# Patient Record
Sex: Female | Born: 1965 | Race: White | Hispanic: No | Marital: Married | Smoking: Never smoker
Health system: Southern US, Community
[De-identification: ages and names within clinical notes are randomized; demographics above are authoritative.]

## PROBLEM LIST (undated history)

## (undated) DIAGNOSIS — F419 Anxiety disorder, unspecified: Secondary | ICD-10-CM

## (undated) DIAGNOSIS — M549 Dorsalgia, unspecified: Secondary | ICD-10-CM

## (undated) DIAGNOSIS — G8929 Other chronic pain: Secondary | ICD-10-CM

## (undated) HISTORY — PX: TUBAL LIGATION: SHX77

---

## 2015-07-29 ENCOUNTER — Encounter (HOSPITAL_BASED_OUTPATIENT_CLINIC_OR_DEPARTMENT_OTHER): Payer: Self-pay | Admitting: *Deleted

## 2015-07-29 ENCOUNTER — Emergency Department (HOSPITAL_BASED_OUTPATIENT_CLINIC_OR_DEPARTMENT_OTHER)
Admission: EM | Admit: 2015-07-29 | Discharge: 2015-07-29 | Disposition: A | Discharge status: Home / Self Care | Payer: Self-pay | Attending: Emergency Medicine | Admitting: Emergency Medicine

## 2015-07-29 DIAGNOSIS — L0291 Cutaneous abscess, unspecified: Secondary | ICD-10-CM

## 2015-07-29 DIAGNOSIS — F419 Anxiety disorder, unspecified: Secondary | ICD-10-CM | POA: Insufficient documentation

## 2015-07-29 DIAGNOSIS — G8929 Other chronic pain: Secondary | ICD-10-CM | POA: Insufficient documentation

## 2015-07-29 DIAGNOSIS — L02212 Cutaneous abscess of back [any part, except buttock]: Secondary | ICD-10-CM | POA: Insufficient documentation

## 2015-07-29 HISTORY — DX: Anxiety disorder, unspecified: F41.9

## 2015-07-29 HISTORY — DX: Other chronic pain: G89.29

## 2015-07-29 HISTORY — DX: Dorsalgia, unspecified: M54.9

## 2015-07-29 MED ORDER — SULFAMETHOXAZOLE-TRIMETHOPRIM 800-160 MG PO TABS: 1.0000 | ORAL_TABLET | Freq: Two times a day (BID) | ORAL | Status: AC

## 2015-07-29 NOTE — Discharge Instructions (Signed)
Please read and follow all provided instructions.  Your diagnoses today include:  1. Abscess     Tests performed today include:  Vital signs. See below for your results today.   Medications prescribed:   Bactrim (trimethoprim/sulfamethoxazole) - antibiotic  You have been prescribed an antibiotic medicine: take the entire course of medicine even if you are feeling better. Stopping early can cause the antibiotic not to work.  Take any prescribed medications only as directed.   Home care instructions:   Follow any educational materials contained in this packet  Follow-up instructions: Return to the Emergency Department in 48 hours for a recheck if your symptoms are not significantly improved.  Return instructions:  Return to the Emergency Department if you have:  Fever  Worsening symptoms  Worsening pain  Worsening swelling  Redness of the skin that moves away from the affected area, especially if it streaks away from the affected area   Any other emergent concerns  Your vital signs today were: BP 135/89 mmHg   Pulse 86   Temp(Src) 98.2 F (36.8 C) (Oral)   Resp 16   Ht 5\' 2"  (1.575 m)   Wt 204 lb (92.534 kg)   BMI 37.30 kg/m2   SpO2 100% If your blood pressure (BP) was elevated above 135/85 this visit, please have this repeated by your doctor within one month. --------------

## 2015-07-29 NOTE — ED Provider Notes (Signed)
CSN: [id number]     Arrival date & time 07/29/15  1521 History   First MD Initiated Contact with Patient 07/29/15 1535     Chief Complaint  Patient presents with  . Abscess     (Consider location/radiation/quality/duration/timing/severity/associated sxs/prior Treatment) HPI Comments: Patient presents with complaint of abscess to the middle of her back which has been occurring for the past 2 weeks. Patient had a firm  Itchy area in this location over the past few years. No similar symptoms prior. No fever, nausea or vomiting. Patient has been applying creams, fat back to the area. It has been draining at times. Onset of symptoms acute. Course is constant.  Patient is a 49 y.o. female presenting with abscess. The history is provided by the patient.  Abscess Associated symptoms: no fever, no nausea and no vomiting     Past Medical History  Diagnosis Date  . Anxiety   . Chronic back pain    History reviewed. No pertinent past surgical history. No family history on file. Social History  Substance Use Topics  . Smoking status: Never Smoker   . Smokeless tobacco: None  . Alcohol Use: No   OB History    No data available     Review of Systems  Constitutional: Negative for fever.  Gastrointestinal: Negative for nausea and vomiting.  Skin: Negative for color change.       Positive for abscess.  Hematological: Negative for adenopathy.      Allergies  Tylenol  Home Medications   Prior to Admission medications   Medication Sig Start Date End Date Taking? Authorizing Provider  LORAZEPAM PO Take by mouth.   Yes Historical Provider, MD  TRAMADOL HCL PO Take by mouth.   Yes Historical Provider, MD  Vitamin D, Ergocalciferol, (DRISDOL) 50000 UNITS CAPS capsule Take 50,000 Units by mouth every 7 (seven) days.   Yes Historical Provider, MD  sulfamethoxazole-trimethoprim (BACTRIM DS,SEPTRA DS) 800-160 MG tablet Take 1 tablet by mouth 2 (two) times daily. 07/29/15 08/05/15  Renne CriglerJoshua  Barbaraann Avans, PA-C   BP 135/89 mmHg  Pulse 86  Temp(Src) 98.2 F (36.8 C) (Oral)  Resp 16  Ht 5\' 2"  (1.575 m)  Wt 204 lb (92.534 kg)  BMI 37.30 kg/m2  SpO2 100% Physical Exam  Constitutional: She appears well-developed and well-nourished.  HENT:  Head: Normocephalic and atraumatic.  Eyes: Conjunctivae are normal.  Neck: Normal range of motion. Neck supple.  Pulmonary/Chest: No respiratory distress.  Musculoskeletal: She exhibits tenderness.  Patient with 2 cm area of induration with overlying erythema to the middle back consistent with abscess. This area is spontaneously draining with pressure. It drains serous fluid with a small amount of pus.  Neurological: She is alert.  Skin: Skin is warm and dry.  Psychiatric: She has a normal mood and affect.  Nursing note and vitals reviewed.   ED Course  Procedures (including critical care time) Labs Review Labs Reviewed - No data to display  Imaging Review No results found. I have personally reviewed and evaluated these images and lab results as part of my medical decision-making.   EKG Interpretation None       4:34 PM Patient seen and examined. Patient refuses incision and drainage. She is adamant about not having any needle sticks. Discussed that we will try course of Bactrim however this may not control and improve her symptoms. If symptoms do not improve or they become worse, she needs to return to the emergency department for incision and drainage. She  verbalizes understanding and agrees with plan.   Vital signs reviewed and are as follows: BP 135/89 mmHg  Pulse 86  Temp(Src) 98.2 F (36.8 C) (Oral)  Resp 16  Ht  (1.575 m)  Wt 204 lb (92.534 kg)  BMI 37.30 kg/m2  SpO2 100%    MDM   Final diagnoses:  Abscess   Patient with draining abscess to her middle back. No fevers or other systemic symptoms of illness. Patient will not allow me to incise and drain the area appropriately today. She understands the risks and  benefits of this. She knows that she may need to return for definitive treatment. Bactrim given.   Renne Crigler, PA-C 07/29/15 1638  Marily Memos, MD 07/29/15 [phone]

## 2015-07-29 NOTE — ED Notes (Signed)
Abscess to the middle of her back.

## 2020-04-18 ENCOUNTER — Other Ambulatory Visit: Payer: Self-pay

## 2020-04-18 ENCOUNTER — Emergency Department (HOSPITAL_BASED_OUTPATIENT_CLINIC_OR_DEPARTMENT_OTHER)
Admission: EM | Admit: 2020-04-18 | Discharge: 2020-04-19 | Disposition: A | Discharge status: Home / Self Care | Payer: Worker's Compensation | Attending: Emergency Medicine | Admitting: Emergency Medicine

## 2020-04-18 ENCOUNTER — Encounter (HOSPITAL_BASED_OUTPATIENT_CLINIC_OR_DEPARTMENT_OTHER): Payer: Self-pay | Admitting: Emergency Medicine

## 2020-04-18 ENCOUNTER — Emergency Department (HOSPITAL_BASED_OUTPATIENT_CLINIC_OR_DEPARTMENT_OTHER): Payer: Worker's Compensation

## 2020-04-18 DIAGNOSIS — Y929 Unspecified place or not applicable: Secondary | ICD-10-CM | POA: Insufficient documentation

## 2020-04-18 DIAGNOSIS — S9781XA Crushing injury of right foot, initial encounter: Secondary | ICD-10-CM | POA: Insufficient documentation

## 2020-04-18 DIAGNOSIS — Y939 Activity, unspecified: Secondary | ICD-10-CM | POA: Diagnosis not present

## 2020-04-18 DIAGNOSIS — W208XXA Other cause of strike by thrown, projected or falling object, initial encounter: Secondary | ICD-10-CM | POA: Diagnosis not present

## 2020-04-18 DIAGNOSIS — T1490XA Injury, unspecified, initial encounter: Secondary | ICD-10-CM

## 2020-04-18 DIAGNOSIS — Y999 Unspecified external cause status: Secondary | ICD-10-CM | POA: Diagnosis not present

## 2020-04-18 DIAGNOSIS — S99921A Unspecified injury of right foot, initial encounter: Secondary | ICD-10-CM | POA: Diagnosis present

## 2020-04-18 NOTE — ED Triage Notes (Signed)
  Patient comes in with R foot pain.  Patient states a metal rail fell on her foot Wednesday night at work and it has not got any better.  R foot is swollen on the top of the foot.  Strong pedal pulse.  Painful to touch and ambulate.  Taking aleve at home for pain.  Pain 0/10 when sitting.

## 2020-04-19 MED ORDER — NAPROXEN 375 MG PO TABS: ORAL_TABLET | ORAL | 0 refills | Status: AC

## 2020-04-19 MED ORDER — NAPROXEN 250 MG PO TABS
500.0000 mg | ORAL_TABLET | Freq: Once | ORAL | Status: AC
  Administered 2020-04-19: 500 mg via ORAL
  Filled 2020-04-19: qty 2

## 2020-04-19 NOTE — ED Provider Notes (Signed)
[facility] Provider Note: Lowella Dell, MD, FACEP  CSN: [id number] MRN: [id number] ARRIVAL: 04/18/20 at 2123 ROOM: [facility]   CHIEF COMPLAINT  Foot Injury   HISTORY OF PRESENT ILLNESS  04/19/20 1:43 AM Heather Oneill is a 54 y.o. female who had a metal rail fall on her right foot in [place] 3 evenings ago.  She has swelling and ecchymosis to her dorsal right foot.  There is tenderness and pain with ambulation which is not severe.  She is having no pain at rest.  She has taken 2 doses of naproxen since that happened.  These were given to her by someone else, she does not have a prescription of her own.   Past Medical History:  Diagnosis Date  . Anxiety   . Chronic back pain     History reviewed. No pertinent surgical history.  History reviewed. No pertinent family history.  Social History   Tobacco Use  . Smoking status: Never Smoker  Substance Use Topics  . Alcohol use: No  . Drug use: No    Prior to Admission medications   Medication Sig Start Date End Date Taking? Authorizing Provider  naproxen (NAPROSYN) 375 MG tablet Take 1 tablet twice daily as needed for pain. 04/19/20   Palin Tristan, MD    Allergies Tylenol [acetaminophen]   REVIEW OF SYSTEMS  Negative except as noted here or in the History of Present Illness.   PHYSICAL EXAMINATION  Initial Vital Signs Blood pressure 116/66, pulse 75, temperature 98.1 F (36.7 C), temperature source Oral, resp. rate 20, height 5\' 2"  (1.575 m), weight 83 kg, SpO2 99 %.  Examination General: Well-developed, well-nourished female in no acute distress; appearance consistent with age of record HENT: normocephalic; atraumatic Eyes: Normal appearance Neck: supple Heart: regular rate and rhythm Lungs: clear to auscultation bilaterally Abdomen: soft; nondistended; nontender; bowel sounds present Extremities: No deformity; pulses normal; swelling and ecchymosis of dorsal right foot, right foot distally  neurovascularly intact with intact tendon function:    Neurologic: Awake, alert and oriented; motor function intact in all extremities and symmetric; no facial droop Skin: Warm and dry Psychiatric: Normal mood and affect   RESULTS  Summary of this visit's results, reviewed and interpreted by myself:   EKG Interpretation  Date/Time:    Ventricular Rate:    PR Interval:    QRS Duration:   QT Interval:    QTC Calculation:   R Axis:     Text Interpretation:        Laboratory Studies: No results found for this or any previous visit (from the past 24 hour(s)). Imaging Studies: DG Foot Complete Right  Result Date: 04/18/2020 CLINICAL DATA:  Right foot pain, crush injury EXAM: RIGHT FOOT COMPLETE - 3+ VIEW COMPARISON:  None. FINDINGS: Normal alignment. No fracture or dislocation. Joint spaces are preserved. There is moderate soft tissue swelling along the dorsal aspect of the right forefoot. No retained radiopaque foreign body. IMPRESSION: Soft tissue swelling.  No fracture or dislocation. Electronically Signed   By: 04/20/2020 MD   On: 04/18/2020 22:10    ED COURSE and MDM  Nursing notes, initial and subsequent vitals signs, including pulse oximetry, reviewed and interpreted by myself.  Vitals:   04/18/20 2134 04/18/20 2352  BP: (!) 139/101 116/66  Pulse: 95 75  Resp: 18 20  Temp: 98.1 F (36.7 C)   TempSrc: Oral   SpO2: 100% 99%  Weight: 83 kg   Height: 5\' 2"  (1.575 m)  Medications  naproxen (NAPROSYN) tablet 500 mg (has no administration in time range)    We will place the patient on naproxen.  Do not believe any immobilization would be beneficial at this time.  There are no signs of compartment syndrome and it has been 3 days since the accident so a late compartment syndrome is unlikely.  PROCEDURES  Procedures   ED DIAGNOSES     ICD-10-CM   1. Crush injury of right foot, initial encounter  M76.72CN        Paula Libra, MD 04/19/20 [phone]

## 2020-08-06 ENCOUNTER — Emergency Department (HOSPITAL_BASED_OUTPATIENT_CLINIC_OR_DEPARTMENT_OTHER)
Admission: EM | Admit: 2020-08-06 | Discharge: 2020-08-06 | Disposition: A | Discharge status: Home / Self Care | Payer: HRSA Program | Attending: Emergency Medicine | Admitting: Emergency Medicine

## 2020-08-06 ENCOUNTER — Other Ambulatory Visit: Payer: Self-pay

## 2020-08-06 ENCOUNTER — Encounter (HOSPITAL_BASED_OUTPATIENT_CLINIC_OR_DEPARTMENT_OTHER): Payer: Self-pay | Admitting: *Deleted

## 2020-08-06 DIAGNOSIS — U071 COVID-19: Secondary | ICD-10-CM | POA: Diagnosis not present

## 2020-08-06 DIAGNOSIS — R000 Tachycardia, unspecified: Secondary | ICD-10-CM | POA: Insufficient documentation

## 2020-08-06 DIAGNOSIS — R110 Nausea: Secondary | ICD-10-CM

## 2020-08-06 DIAGNOSIS — R197 Diarrhea, unspecified: Secondary | ICD-10-CM

## 2020-08-06 DIAGNOSIS — Z9851 Tubal ligation status: Secondary | ICD-10-CM | POA: Diagnosis not present

## 2020-08-06 DIAGNOSIS — R509 Fever, unspecified: Secondary | ICD-10-CM | POA: Diagnosis present

## 2020-08-06 LAB — CBC WITH DIFFERENTIAL/PLATELET
Abs Immature Granulocytes: 0.01 10*3/uL (ref 0.00–0.07)
Basophils Absolute: 0 10*3/uL (ref 0.0–0.1)
Basophils Relative: 1 %
Eosinophils Absolute: 0 10*3/uL (ref 0.0–0.5)
Eosinophils Relative: 0 %
HCT: 46.7 % — ABNORMAL HIGH (ref 36.0–46.0)
Hemoglobin: 14.8 g/dL (ref 12.0–15.0)
Immature Granulocytes: 0 %
Lymphocytes Relative: 37 %
Lymphs Abs: 1.6 10*3/uL (ref 0.7–4.0)
MCH: 27 pg (ref 26.0–34.0)
MCHC: 31.7 g/dL (ref 30.0–36.0)
MCV: 85.2 fL (ref 80.0–100.0)
Monocytes Absolute: 0.4 10*3/uL (ref 0.1–1.0)
Monocytes Relative: 10 %
Neutro Abs: 2.2 10*3/uL (ref 1.7–7.7)
Neutrophils Relative %: 52 %
Platelets: 160 10*3/uL (ref 150–400)
RBC: 5.48 MIL/uL — ABNORMAL HIGH (ref 3.87–5.11)
RDW: 13.4 % (ref 11.5–15.5)
Smear Review: NORMAL
WBC: 4.2 10*3/uL (ref 4.0–10.5)
nRBC: 0 % (ref 0.0–0.2)

## 2020-08-06 LAB — BASIC METABOLIC PANEL
Anion gap: 12 (ref 5–15)
BUN: 8 mg/dL (ref 6–20)
CO2: 26 mmol/L (ref 22–32)
Calcium: 8.6 mg/dL — ABNORMAL LOW (ref 8.9–10.3)
Chloride: 99 mmol/L (ref 98–111)
Creatinine, Ser: 0.74 mg/dL (ref 0.44–1.00)
GFR, Estimated: >60 mL/min (ref >60)
Glucose, Bld: 110 mg/dL — ABNORMAL HIGH (ref 70–99)
Potassium: 3.5 mmol/L (ref 3.5–5.1)
Sodium: 137 mmol/L (ref 135–145)

## 2020-08-06 MED ORDER — ONDANSETRON 4 MG PO TBDP: 4.0000 mg | ORAL_TABLET | Freq: Three times a day (TID) | ORAL | 0 refills | Status: AC | PRN

## 2020-08-06 MED ORDER — SODIUM CHLORIDE 0.9 % IV BOLUS
1000.0000 mL | Freq: Once | INTRAVENOUS | Status: AC
  Administered 2020-08-06: 1000 mL via INTRAVENOUS

## 2020-08-06 NOTE — ED Provider Notes (Signed)
[facility] EMERGENCY DEPARTMENT Provider Note   CSN: [id number] Arrival date & time: 08/06/20  1750     History Chief Complaint  Patient presents with  . Covid Positive    Heather Oneill is a 54 y.o. female with PMHx anxiety who presents to the ED today with continued nausea and watery diarrhea x 8 days. Pt reports she woke up with a headache 8 days ago and fevers - she went to get tested for COVID 19 and tested positive. She reports that since then she has not been able to eat or drink much of anything due to continued nausea. She also reports everything makes her have diarrhea. She reports about 2 episodes of diarrhea per day however states "it's less because I haven't been eating or drinking much of anything." Pt also reports her daughter told her she could land in the ICU with continued diarrhea prompting her to come to the ED today. Pt's fevers have stopped. She denies any chest pain, shortness of breath, abdominal pain, vomiting, or any other associated symptoms.   The history is provided by the patient and medical records.       Past Medical History:  Diagnosis Date  . Anxiety   . Chronic back pain     There are no problems to display for this patient.   Past Surgical History:  Procedure Laterality Date  . TUBAL LIGATION       OB History   No obstetric history on file.     No family history on file.  Social History   Tobacco Use  . Smoking status: Never Smoker  Substance Use Topics  . Alcohol use: No  . Drug use: No    Home Medications Prior to Admission medications   Medication Sig Start Date End Date Taking? Authorizing Provider  naproxen (NAPROSYN) 375 MG tablet Take 1 tablet twice daily as needed for pain. 04/19/20   Molpus, John, MD  ondansetron (ZOFRAN ODT) 4 MG disintegrating tablet Take 1 tablet (4 mg total) by mouth every 8 (eight) hours as needed for nausea or vomiting. 08/06/20   Tanda Rockers, PA-C    Allergies    Tylenol  [acetaminophen]  Review of Systems   Review of Systems  Constitutional: Positive for fatigue and fever (resolved).  Respiratory: Negative for shortness of breath.   Cardiovascular: Negative for chest pain.  Gastrointestinal: Positive for diarrhea and nausea. Negative for abdominal pain and vomiting.  All other systems reviewed and are negative.   Physical Exam Updated Vital Signs BP 126/89   Pulse (!) 116   Temp 98.6 F (37 C) (Oral)   Resp 18   Ht 5\' 2"  (1.575 m)   Wt 104.3 kg   SpO2 94%   BMI 42.07 kg/m   Physical Exam Vitals and nursing note reviewed.  Constitutional:      Appearance: She is obese. She is not ill-appearing or diaphoretic.  HENT:     Head: Normocephalic and atraumatic.     Mouth/Throat:     Mouth: Mucous membranes are dry.  Eyes:     Conjunctiva/sclera: Conjunctivae normal.  Cardiovascular:     Rate and Rhythm: Regular rhythm. Tachycardia present.  Pulmonary:     Effort: Pulmonary effort is normal.     Breath sounds: Normal breath sounds. No wheezing, rhonchi or rales.  Abdominal:     Palpations: Abdomen is soft.     Tenderness: There is no abdominal tenderness. There is no guarding or rebound.  Musculoskeletal:  Cervical back: Neck supple.  Skin:    General: Skin is warm and dry.  Neurological:     Mental Status: She is alert.     ED Results / Procedures / Treatments   Labs (all labs ordered are listed, but only abnormal results are displayed) Labs Reviewed  BASIC METABOLIC PANEL - Abnormal; Notable for the following components:      Result Value   Glucose, Bld 110 (*)    Calcium 8.6 (*)    All other components within normal limits  CBC WITH DIFFERENTIAL/PLATELET - Abnormal; Notable for the following components:   RBC 5.48 (*)    HCT 46.7 (*)    All other components within normal limits    EKG None  Radiology No results found.  Procedures Procedures (including critical care time)  Medications Ordered in ED Medications    sodium chloride 0.9 % bolus 1,000 mL (1,000 mLs Intravenous New Bag/Given 08/06/20 1839)    ED Course  I have reviewed the triage vital signs and the nursing notes.  Pertinent labs & imaging results that were available during my care of the patient were reviewed by me and considered in my medical decision making (see chart for details).    MDM Rules/Calculators/A&P                          54 year old female presenting to the ED today with continued nausea and diarrhea for the past 8 days after testing positive for COVID 19. Pt has not been eating or drinking much of anything due to these symptoms and presented to the ED with concern that she could be admitted to the hospital with electrolyte derangements. On arrival to the ED pt is afebrile. She is tachycardic in the 110s. Nontachypneic. Satting 94% on RA - denies any shortness of breath. On exam pt has no abdominal TTP to suggest acute abdomen at this time. She does have dry MM. Suspect tachycardia is related to dehydration. Will plan for CBC and BMP and 1 L IVF and reassessment. I do not feel pt requires admission at this time unless she has severe electrolyte abnormalities. She denies any SOB; do not feel she requires CXR or EKG at this time.   CBC without leukocytosis. Hgb stable at 14.8 BMP without electrolyte abnormalities today. Potassium 3.5. Sodium 137. Chloride 99. Creatinine 0.74.   After fluids heart rate has decreased to 94. Will discharge home at this time with zofran for nausea. Pt instructed to continue self isolating until she is done with her quarantine. She is in agreement with plan and stable for discharge home.   This note was prepared using Dragon voice recognition software and may include unintentional dictation errors due to the inherent limitations of voice recognition software.  Heather Oneill was evaluated in Emergency Department on 08/06/2020 for the symptoms described in the history of present illness. She was  evaluated in the context of the global COVID-19 pandemic, which necessitated consideration that the patient might be at risk for infection with the SARS-CoV-2 virus that causes COVID-19. Institutional protocols and algorithms that pertain to the evaluation of patients at risk for COVID-19 are in a state of rapid change based on information released by regulatory bodies including the CDC and federal and state organizations. These policies and algorithms were followed during the patient's care in the ED.   Final Clinical Impression(s) / ED Diagnoses Final diagnoses:  COVID-19  Nausea  Diarrhea, unspecified type  Rx / DC Orders ED Discharge Orders         Ordered    ondansetron (ZOFRAN ODT) 4 MG disintegrating tablet  Every 8 hours PRN        08/06/20 1934           Discharge Instructions     Please pick up nausea medication and take as needed. It is important to drink plenty of fluids to stay hydrated and to eat three times a day even if you do not feel like eating as your body needs fuel to function.   Continue self isolating at home until you are finished with your quarantine.   Follow up with your PCP afterwards for reevaluation.   Return to the ED IMMEDIATELY for any worsening symptoms including new abdominal pain, vomiting, passing out, or any other new/concerning symptoms.        Tanda Rockers, PA-C 08/06/20 1934    Gwyneth Sprout, MD 08/06/20 2232

## 2020-08-06 NOTE — Discharge Instructions (Addendum)
Please pick up nausea medication and take as needed. It is important to drink plenty of fluids to stay hydrated and to eat three times a day even if you do not feel like eating as your body needs fuel to function.   Continue self isolating at home until you are finished with your quarantine.   Follow up with your PCP afterwards for reevaluation.   Return to the ED IMMEDIATELY for any worsening symptoms including new abdominal pain, vomiting, passing out, or any other new/concerning symptoms.

## 2020-08-06 NOTE — ED Triage Notes (Signed)
Pt reports covid + with n/d x 5 days

## 2021-06-21 IMAGING — CR DG FOOT COMPLETE 3+V*R*
3 series · 3 of 3 positions shown · non-contrast
Comparison: None.

CLINICAL DATA: Right foot pain, crush injury

EXAM:
RIGHT FOOT COMPLETE - 3+ VIEW

[t foot ap right]
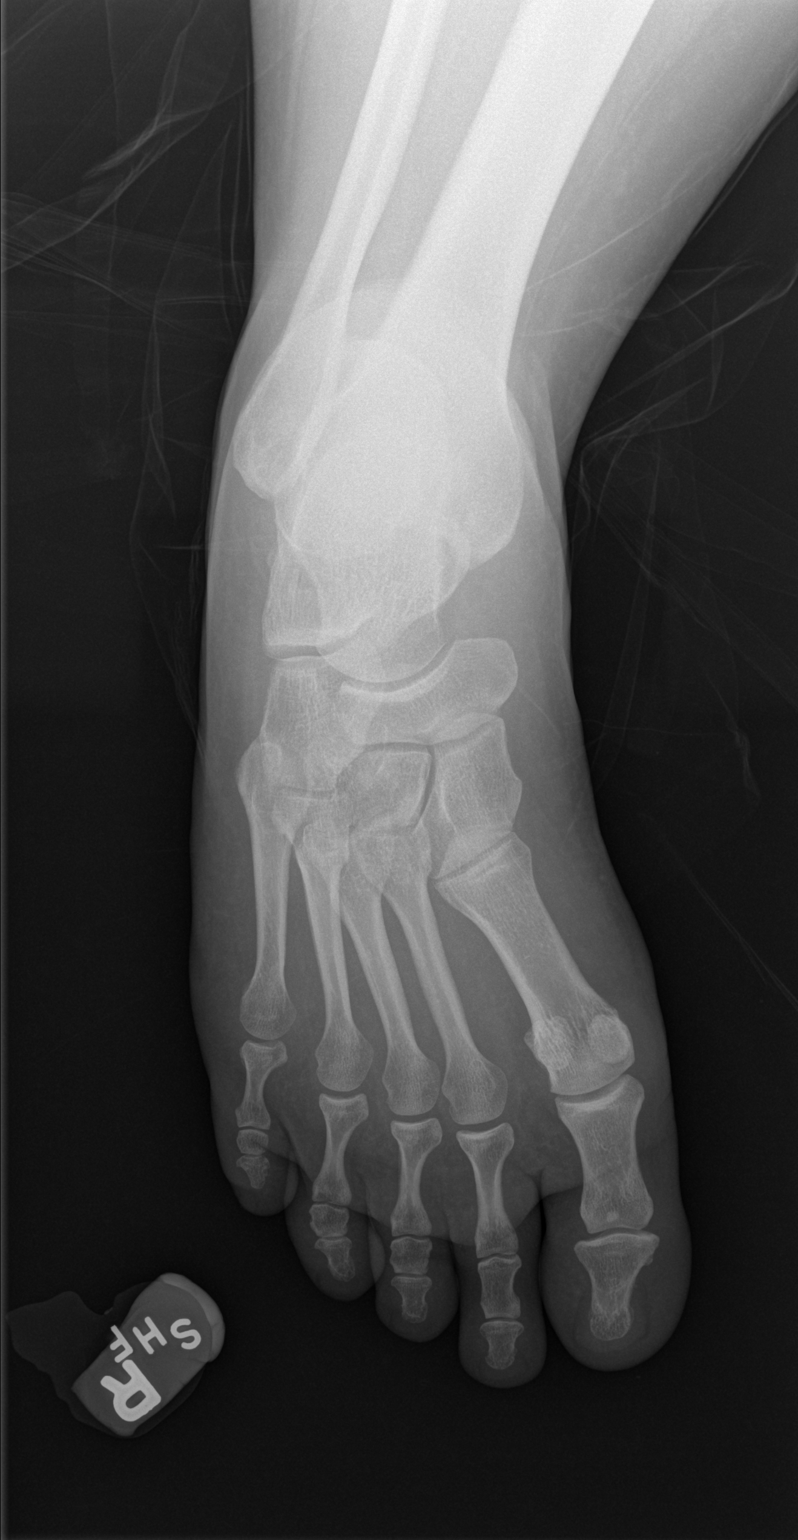

[t foot oblique right]
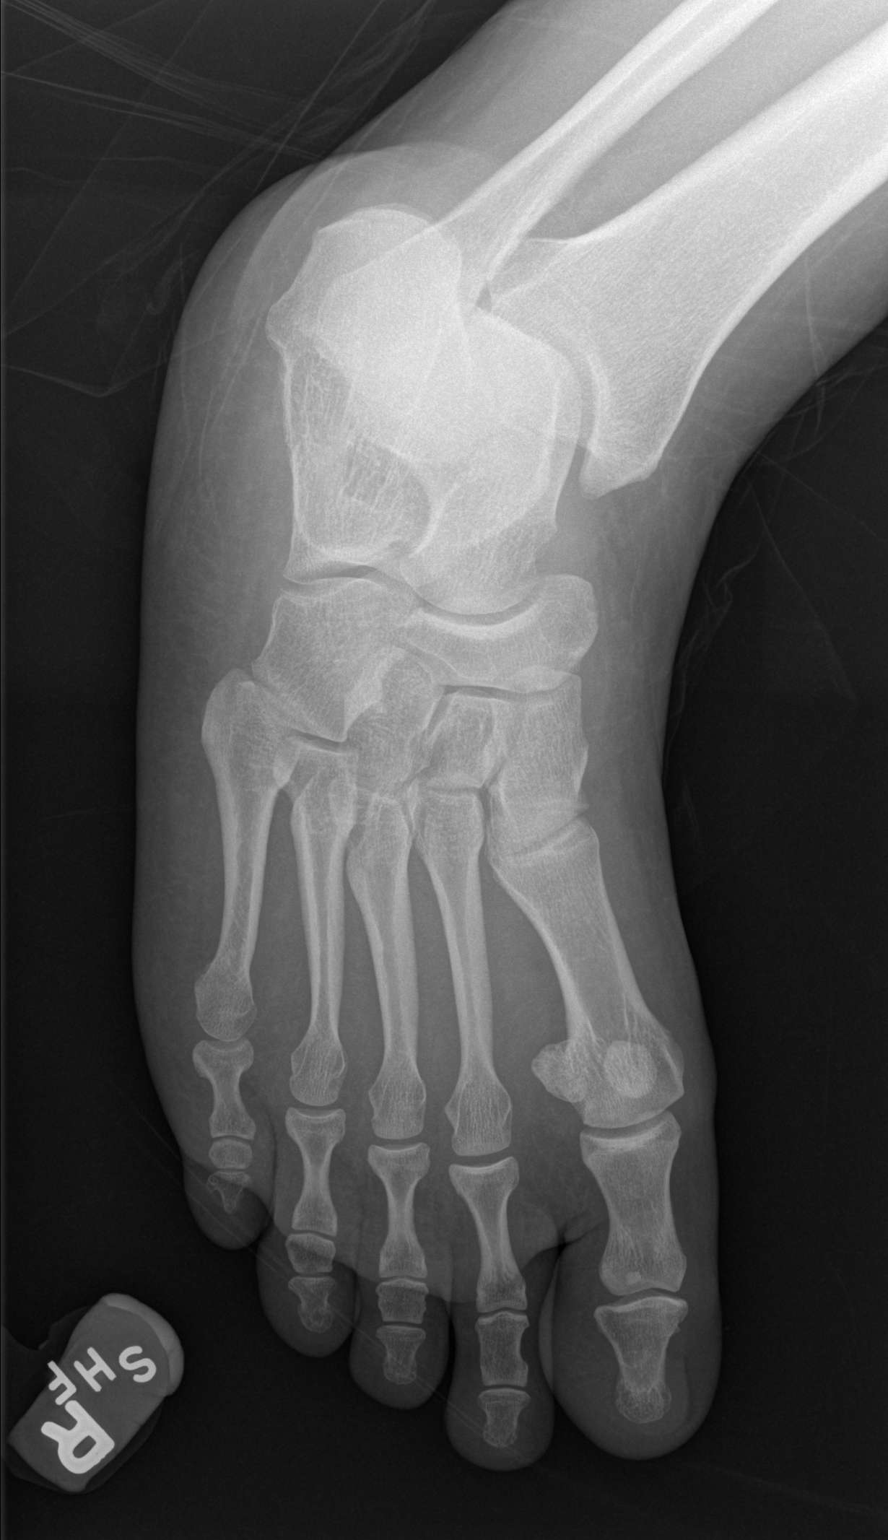

[t foot lat right]
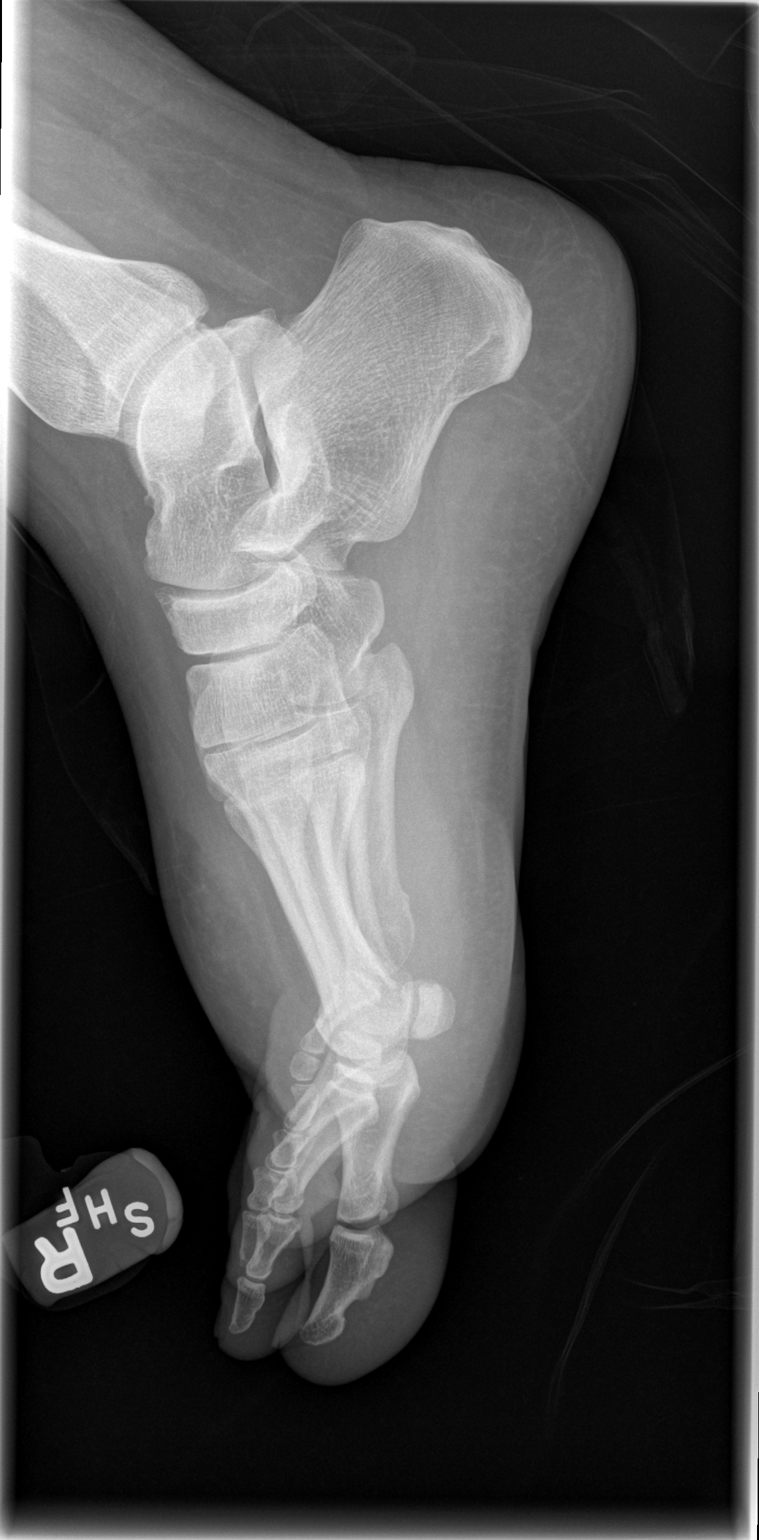

[3 of 3 positions shown; findings below may reference images not displayed]

FINDINGS: Normal alignment. No fracture or dislocation. Joint spaces are
preserved. There is moderate soft tissue swelling along the dorsal
aspect of the right forefoot. No retained radiopaque foreign body.
IMPRESSION: Soft tissue swelling.  No fracture or dislocation.
